# Patient Record
Sex: Female | Born: 1955 | Race: White | Hispanic: No | Smoking: Former smoker
Health system: Southern US, Community
[De-identification: ages and names within clinical notes are randomized; demographics above are authoritative.]

---

## 1997-12-08 ENCOUNTER — Other Ambulatory Visit: Admission: RE | Admit: 1997-12-08 | Discharge: 1997-12-08 | Payer: Self-pay | Admitting: Obstetrics and Gynecology

## 1998-11-23 ENCOUNTER — Other Ambulatory Visit: Admission: RE | Admit: 1998-11-23 | Discharge: 1998-11-23 | Payer: Self-pay | Admitting: Psychiatry

## 1999-01-04 ENCOUNTER — Other Ambulatory Visit: Admission: RE | Admit: 1999-01-04 | Discharge: 1999-01-04 | Payer: Self-pay | Admitting: Urology

## 1999-01-10 ENCOUNTER — Other Ambulatory Visit: Admission: RE | Admit: 1999-01-10 | Discharge: 1999-01-10 | Payer: Self-pay | Admitting: Obstetrics and Gynecology

## 1999-01-22 ENCOUNTER — Encounter: Admission: RE | Admit: 1999-01-22 | Discharge: 1999-01-22 | Payer: Self-pay | Admitting: Urology

## 1999-01-22 ENCOUNTER — Encounter: Payer: Self-pay | Admitting: Urology

## 1999-11-27 ENCOUNTER — Other Ambulatory Visit: Admission: RE | Admit: 1999-11-27 | Discharge: 1999-11-27 | Payer: Self-pay | Admitting: Obstetrics and Gynecology

## 2000-12-08 ENCOUNTER — Other Ambulatory Visit: Admission: RE | Admit: 2000-12-08 | Discharge: 2000-12-08 | Payer: Self-pay | Admitting: Obstetrics and Gynecology

## 2001-01-13 ENCOUNTER — Ambulatory Visit (HOSPITAL_COMMUNITY): Admission: RE | Admit: 2001-01-13 | Discharge: 2001-01-13 | Payer: Self-pay | Admitting: Obstetrics and Gynecology

## 2001-01-13 ENCOUNTER — Encounter: Payer: Self-pay | Admitting: Obstetrics and Gynecology

## 2001-11-09 ENCOUNTER — Other Ambulatory Visit: Admission: RE | Admit: 2001-11-09 | Discharge: 2001-11-09 | Payer: Self-pay | Admitting: Obstetrics and Gynecology

## 2002-12-07 ENCOUNTER — Other Ambulatory Visit: Admission: RE | Admit: 2002-12-07 | Discharge: 2002-12-07 | Payer: Self-pay | Admitting: Obstetrics and Gynecology

## 2005-03-18 ENCOUNTER — Encounter: Admission: RE | Admit: 2005-03-18 | Discharge: 2005-03-18 | Payer: Self-pay | Admitting: Obstetrics and Gynecology

## 2005-04-04 ENCOUNTER — Encounter: Admission: RE | Admit: 2005-04-04 | Discharge: 2005-04-04 | Payer: Self-pay | Admitting: Obstetrics and Gynecology

## 2005-04-10 ENCOUNTER — Encounter: Admission: RE | Admit: 2005-04-10 | Discharge: 2005-04-10 | Payer: Self-pay | Admitting: Obstetrics and Gynecology

## 2006-05-05 ENCOUNTER — Encounter: Admission: RE | Admit: 2006-05-05 | Discharge: 2006-05-05 | Payer: Self-pay | Admitting: Obstetrics and Gynecology

## 2007-01-06 ENCOUNTER — Other Ambulatory Visit: Admission: RE | Admit: 2007-01-06 | Discharge: 2007-01-06 | Payer: Self-pay | Admitting: Obstetrics and Gynecology

## 2007-06-03 ENCOUNTER — Encounter: Admission: RE | Admit: 2007-06-03 | Discharge: 2007-06-03 | Payer: Self-pay | Admitting: Obstetrics and Gynecology

## 2007-11-11 ENCOUNTER — Ambulatory Visit (HOSPITAL_COMMUNITY): Admission: RE | Admit: 2007-11-11 | Discharge: 2007-11-11 | Payer: Self-pay | Admitting: Internal Medicine

## 2007-12-04 ENCOUNTER — Ambulatory Visit: Payer: Self-pay | Admitting: Internal Medicine

## 2007-12-18 ENCOUNTER — Ambulatory Visit: Payer: Self-pay | Admitting: Internal Medicine

## 2008-08-08 ENCOUNTER — Encounter: Admission: RE | Admit: 2008-08-08 | Discharge: 2008-08-08 | Payer: Self-pay | Admitting: Internal Medicine

## 2009-04-16 ENCOUNTER — Ambulatory Visit: Payer: Self-pay | Admitting: Family Medicine

## 2009-04-16 DIAGNOSIS — J309 Allergic rhinitis, unspecified: Secondary | ICD-10-CM | POA: Insufficient documentation

## 2009-11-15 ENCOUNTER — Encounter: Admission: RE | Admit: 2009-11-15 | Discharge: 2009-11-15 | Payer: Self-pay | Admitting: Internal Medicine

## 2010-03-27 NOTE — Assessment & Plan Note (Signed)
Summary: R ears stopped up x 2 dys rm 1   Vital Signs:  Patient Profile:   55 Years Old Female Height:     67.5 inches Weight:      193 pounds O2 Sat:      100 % O2 treatment:    Room Air Temp:     97.2 degrees F oral Pulse rate:   71 / minute Pulse rhythm:   regular Resp:     16 per minute BP sitting:   140 / 83  (right arm) Cuff size:   regular  Vitals Entered By: Areta Haber CMA (April 16, 2009 3:37 PM)                  Current Allergies: No known allergies History of Present Illness History from: patient Chief Complaint: R ear stopped up x 2dys History of Present Illness: h/o allergies and asthma as child comes c/o  dyscomfort in her right ear for 2 days. Feels like her ear is "stuffed up" and some sounds are  magnified.  No fever no cough no malaise. Mild throat dyscomfort. Normal apetite. Had flu vaccine this year   Current Problems: ALLERGIC RHINITIS CAUSE UNSPECIFIED (ICD-477.9)   Current Meds VITAMIN D 1000 UNIT TABS (CHOLECALCIFEROL) 2 tabs per week BUDEPRION SR 150 MG XR12H-TAB (BUPROPION HCL) 1 tab by mouth once daily  REVIEW OF SYSTEMS Constitutional Symptoms      Denies fever, chills, night sweats, weight loss, weight gain, and fatigue.  Eyes       Denies change in vision, eye pain, eye discharge, glasses, contact lenses, and eye surgery. Ear/Nose/Throat/Mouth       Denies hearing loss/aids, change in hearing, dizziness, frequent runny nose, frequent nose bleeds, sinus problems, sore throat, hoarseness, and tooth pain or bleeding.      Comments: R ear stopped up x 2 dys  Respiratory       Denies dry cough, productive cough, wheezing, shortness of breath, asthma, bronchitis, and emphysema/COPD.  Cardiovascular       Denies murmurs, chest pain, and tires easily with exhertion.    Gastrointestinal       Denies stomach pain, nausea/vomiting, diarrhea, constipation, blood in bowel movements, and indigestion. Genitourniary       Denies painful  urination, kidney stones, and loss of urinary control. Neurological       Denies paralysis, seizures, and fainting/blackouts. Musculoskeletal       Denies muscle pain, joint pain, joint stiffness, decreased range of motion, redness, swelling, muscle weakness, and gout.  Skin       Denies bruising, unusual mles/lumps or sores, and hair/skin or nail changes.  Psych       Denies mood changes, temper/anger issues, anxiety/stress, speech problems, depression, and sleep problems. Physical Exam General appearance: well developed, well nourished, no acute distress Head: normocephalic, atraumatic Eyes: conjunctivae and lids normal Pupils: equal, round, reactive to light Ears: Impress clear fluid behind Normal looking TM's. Nasal: mucosa pink, nonedematous, no septal deviation, turbinates normal Oral/Pharynx: tongue normal, posterior pharynx without erythema or exudate. Post nasal drip. Neck: neck supple,  trachea midline, no masses. Not enlarged or tender lymphnodes. Chest/Lungs: no rales, wheezes, or rhonchi bilateral, breath sounds equal without effort Heart: regular rate and  rhythm, no murmur Assessment New Problems: ALLERGIC RHINITIS CAUSE UNSPECIFIED (ICD-477.9)  Likely flid behing TM due to allergic rhinitis causing pressure and dyscomfort. Or early signs of a viral URI. Reccomended to use a a decongestant  two times a day  for 1 week. (claritin-D12 or ZyrtecD12) Follow up with primary doctor if persistent or worsening symptoms.   Plan New Orders: Est. Patient Level III [99213]  The patient and/or caregiver has been counseled thoroughly with regard to medications prescribed including dosage, schedule, interactions, rationale for use, and possible side effects and they verbalize understanding.  Diagnoses and expected course of recovery discussed and will return if not improved as expected or if the condition worsens. Patient and/or caregiver verbalized understanding.   Patient  Instructions: 1)  Your ear dyscomfort is likely caused by allergies or it could be an early sign of a coming viral infection. 2)  Use a decongestant like claritin d-12 take twice a day for the next week. 3)  Follow up with your primary doctor if persistent dyscomfort after 1 week or sooner if worsening.  CC:  R ear stopped up x 2dys.

## 2010-12-20 ENCOUNTER — Other Ambulatory Visit: Payer: Self-pay | Admitting: Internal Medicine

## 2010-12-20 DIAGNOSIS — Z1231 Encounter for screening mammogram for malignant neoplasm of breast: Secondary | ICD-10-CM

## 2011-01-28 ENCOUNTER — Ambulatory Visit
Admission: RE | Admit: 2011-01-28 | Discharge: 2011-01-28 | Disposition: A | Discharge status: Home / Self Care | Payer: BC Managed Care – PPO | Source: Ambulatory Visit | Attending: Internal Medicine | Admitting: Internal Medicine

## 2011-01-28 DIAGNOSIS — Z1231 Encounter for screening mammogram for malignant neoplasm of breast: Secondary | ICD-10-CM

## 2012-01-15 ENCOUNTER — Other Ambulatory Visit: Payer: Self-pay | Admitting: Internal Medicine

## 2012-01-15 DIAGNOSIS — Z1231 Encounter for screening mammogram for malignant neoplasm of breast: Secondary | ICD-10-CM

## 2012-02-06 ENCOUNTER — Ambulatory Visit (HOSPITAL_BASED_OUTPATIENT_CLINIC_OR_DEPARTMENT_OTHER)
Admission: RE | Admit: 2012-02-06 | Discharge: 2012-02-06 | Disposition: A | Discharge status: Home / Self Care | Payer: BC Managed Care – PPO | Source: Ambulatory Visit | Attending: Internal Medicine | Admitting: Internal Medicine

## 2012-02-06 ENCOUNTER — Ambulatory Visit: Payer: BC Managed Care – PPO

## 2012-02-06 DIAGNOSIS — Z1231 Encounter for screening mammogram for malignant neoplasm of breast: Secondary | ICD-10-CM | POA: Insufficient documentation

## 2012-07-31 ENCOUNTER — Encounter: Payer: Self-pay | Admitting: Physician Assistant

## 2012-07-31 ENCOUNTER — Ambulatory Visit (INDEPENDENT_AMBULATORY_CARE_PROVIDER_SITE_OTHER): Payer: BC Managed Care – PPO | Admitting: Physician Assistant

## 2012-07-31 VITALS — BP 138/90 | HR 80 | Wt 199.0 lb

## 2012-07-31 DIAGNOSIS — Z131 Encounter for screening for diabetes mellitus: Secondary | ICD-10-CM

## 2012-07-31 DIAGNOSIS — M25562 Pain in left knee: Secondary | ICD-10-CM

## 2012-07-31 DIAGNOSIS — F329 Major depressive disorder, single episode, unspecified: Secondary | ICD-10-CM | POA: Insufficient documentation

## 2012-07-31 DIAGNOSIS — M25569 Pain in unspecified knee: Secondary | ICD-10-CM

## 2012-07-31 DIAGNOSIS — E785 Hyperlipidemia, unspecified: Secondary | ICD-10-CM

## 2012-07-31 MED ORDER — BUPROPION HCL ER (XL) 150 MG PO TB24: 150.0000 mg | ORAL_TABLET | Freq: Every day | ORAL | Status: DC

## 2012-07-31 NOTE — Patient Instructions (Addendum)
Take glucosamine chondrotin twice a day.   Osteoarthritis Osteoarthritis is the most common form of arthritis. It is redness, soreness, and swelling (inflammation) affecting the cartilage. Cartilage acts as a cushion, covering the ends of bones where they meet to form a joint. CAUSES  Over time, the cartilage begins to wear away. This causes bone to rub on bone. This produces pain and stiffness in the affected joints. Factors that contribute to this problem are:  Excessive body weight.  Age.  Overuse of joints. SYMPTOMS   People with osteoarthritis usually experience joint pain, swelling, or stiffness.  Over time, the joint may lose its normal shape.  Small deposits of bone (osteophytes) may grow on the edges of the joint.  Bits of bone or cartilage can break off and float inside the joint space. This may cause more pain and damage.  Osteoarthritis can lead to depression, anxiety, feelings of helplessness, and limitations on daily activities. The most commonly affected joints are in the:  Ends of the fingers.  Thumbs.  Neck.  Lower back.  Knees.  Hips. DIAGNOSIS  Diagnosis is mostly based on your symptoms and exam. Tests may be helpful, including:  X-rays of the affected joint.  A computerized magnetic scan (MRI).  Blood tests to rule out other types of arthritis.  Joint fluid tests. This involves using a needle to draw fluid from the joint and examining the fluid under a microscope. TREATMENT  Goals of treatment are to control pain, improve joint function, maintain a normal body weight, and maintain a healthy lifestyle. Treatment approaches may include:  A prescribed exercise program with rest and joint relief.  Weight control with nutritional education.  Pain relief techniques such as:  Properly applied heat and cold.  Electric pulses delivered to nerve endings under the skin (transcutaneous electrical nerve stimulation, TENS).  Massage.  Certain  supplements. Ask your caregiver before using any supplements, especially in combination with prescribed drugs.  Medicines to control pain, such as:  Acetaminophen.  Nonsteroidal anti-inflammatory drugs (NSAIDs), such as naproxen.  Narcotic or central-acting agents, such as tramadol. This drug carries a risk of addiction and is generally prescribed for short-term use.  Corticosteroids. These can be given orally or as injection. This is a short-term treatment, not recommended for routine use.  Surgery to reposition the bones and relieve pain (osteotomy) or to remove loose pieces of bone and cartilage. Joint replacement may be needed in advanced states of osteoarthritis. HOME CARE INSTRUCTIONS  Your caregiver can recommend specific types of exercise. These may include:  Strengthening exercises. These are done to strengthen the muscles that support joints affected by arthritis. They can be performed with weights or with exercise bands to add resistance.  Aerobic activities. These are exercises, such as brisk walking or low-impact aerobics, that get your heart pumping. They can help keep your lungs and circulatory system in shape.  Range-of-motion activities. These keep your joints limber.  Balance and agility exercises. These help you maintain daily living skills. Learning about your condition and being actively involved in your care will help improve the course of your osteoarthritis. SEEK MEDICAL CARE IF:   You feel hot or your skin turns red.  You develop a rash in addition to your joint pain.  You have an oral temperature above 102 F (38.9 C). FOR MORE INFORMATION  National Institute of Arthritis and Musculoskeletal and Skin Diseases: www.niams.http://www.myers.net/ [employer] on Aging: https://walker.com/ American College of Rheumatology: www.rheumatology.org Document Released: 02/11/2005 Document Revised: 05/06/2011 Document  Reviewed: 05/25/2009 ExitCare Patient Information 2014  Exit[place], [place]. 1.5 Gram Low Sodium Diet A 1.5 gram sodium diet restricts the amount of sodium in the diet to no more than 1.5 g or 1500 mg daily. The American Heart Association recommends Americans over the age of 55 to consume no more than 1500 mg of sodium each day to reduce the risk of developing high blood pressure. Research also shows that limiting sodium may reduce heart attack and stroke risk. Many foods contain sodium for flavor and sometimes as a preservative. When the amount of sodium in a diet needs to be low, it is important to know what to look for when choosing foods and drinks. The following includes some information and guidelines to help make it easier for you to adapt to a low sodium diet. QUICK TIPS  Do not add salt to food.  Avoid convenience items and fast food.  Choose unsalted snack foods.  Buy lower sodium products, often labeled as "lower sodium" or "no salt added."  Check food labels to learn how much sodium is in 1 serving.  When eating at a restaurant, ask that your food be prepared with less salt or none, if possible. READING FOOD LABELS FOR SODIUM INFORMATION The nutrition facts label is a good place to find how much sodium is in foods. Look for products with no more than 400 mg of sodium per serving. Remember that 1.5 g = 1500 mg. The food label may also list foods as:  Sodium-free: Less than 5 mg in a serving.  Very low sodium: 35 mg or less in a serving.  Low-sodium: 140 mg or less in a serving.  Light in sodium: 50% less sodium in a serving. For example, if a food that usually has 300 mg of sodium is changed to become light in sodium, it will have 150 mg of sodium.  Reduced sodium: 25% less sodium in a serving. For example, if a food that usually has 400 mg of sodium is changed to reduced sodium, it will have 300 mg of sodium. CHOOSING FOODS Grains  Avoid: Salted crackers and snack items. Some cereals, including instant hot cereals. Bread stuffing  and biscuit mixes. Seasoned rice or pasta mixes.  Choose: Unsalted snack items. Low-sodium cereals, oats, puffed wheat and rice, shredded wheat. English muffins and bread. Pasta. Meats  Avoid: Salted, canned, smoked, spiced, pickled meats, including fish and poultry. Bacon, ham, sausage, cold cuts, hot dogs, anchovies.  Choose: Low-sodium canned tuna and salmon. Fresh or frozen meat, poultry, and fish. Dairy  Avoid: Processed cheese and spreads. Cottage cheese. Buttermilk and condensed milk. Regular cheese.  Choose: Milk. Low-sodium cottage cheese. Yogurt. Sour cream. Low-sodium cheese. Fruits and Vegetables  Avoid: Regular canned vegetables. Regular canned tomato sauce and paste. Frozen vegetables in sauces. Olives. Rosita Fire. Relishes. Sauerkraut.  Choose: Low-sodium canned vegetables. Low-sodium tomato sauce and paste. Frozen or fresh vegetables. Fresh and frozen fruit. Condiments  Avoid: Canned and packaged gravies. Worcestershire sauce. Tartar sauce. Barbecue sauce. Soy sauce. Steak sauce. Ketchup. Onion, garlic, and table salt. Meat flavorings and tenderizers.  Choose: Fresh and dried herbs and spices. Low-sodium varieties of mustard and ketchup. Lemon juice. Tabasco sauce. Horseradish. SAMPLE 1.5 GRAM SODIUM MEAL PLAN Breakfast / Sodium (mg)  1 cup low-fat milk / 143 mg  1 whole-wheat English muffin / 240 mg  1 tbs heart-healthy margarine / 153 mg  1 hard-boiled egg / 139 mg  1 small orange / 0 mg Lunch / Sodium (mg)  1 cup raw carrots / 76 mg  2 tbs no salt added peanut butter / 5 mg  2 slices whole-wheat bread / 270 mg  1 tbs jelly / 6 mg   cup red grapes / 2 mg Dinner / Sodium (mg)  1 cup whole-wheat pasta / 2 mg  1 cup low-sodium tomato sauce / 73 mg  3 oz lean ground beef / 57 mg  1 small side salad (1 cup raw spinach leaves,  cup cucumber,  cup yellow bell pepper) with 1 tsp olive oil and 1 tsp red wine vinegar / 25 mg Snack / Sodium (mg)  1  container low-fat vanilla yogurt / 107 mg  3 graham cracker squares / 127 mg Nutrient Analysis  Calories: 1745  Protein: 75 g  Carbohydrate: 237 g  Fat: 57 g  Sodium: 1425 mg Document Released: 02/11/2005 Document Revised: 05/06/2011 Document Reviewed: 05/15/2009 ExitCare Patient Information 2014 [place], [place].

## 2012-08-02 NOTE — Progress Notes (Addendum)
  Subjective:    Patient ID: Kelsey Brady, female    DOB: [birth year], 57 y.o.   MRN: [id number]  HPI Patient is a 57 yo female who presents to the clinic to establish care. PMH positive for depression and hyperlipidemia. She is currently going to a mental health facility to get wellbutrin and not on any meds for high cholesterol. She is very opposed to taking a statin. She does take red yeast rice and fish oil to help lower cholesterol. Has not had checked in many years. She is very well controlled with depression on wellbutrin with no complaints. She tries to keep a balanced diet low in fat but admits she has gained weight over past couple of years and not exercising. No history of problems with hypertension. BP elevated today.Denies any CP, palpitations, SOB, HA's, vision changes.   Her left knee occasionally causing some short term pain. Mostly with activity such as walking up stairs or walking for long periods of time. Pain is not everyday. Denies any knee catching or giving way. She denies any injury. She has not tried anything for knee pain. Left only right know.   Mammogram 01/2012. Pap not been done in years.    Review of Systems  All other systems reviewed and are negative.       Objective:   Physical Exam  Constitutional: She appears well-developed and well-nourished.  HENT:  Head: Normocephalic and atraumatic.  Neck: Normal range of motion. Neck supple. No thyromegaly present.  Cardiovascular: Normal rate, regular rhythm and normal heart sounds.   Pulmonary/Chest: Effort normal and breath sounds normal. She has no wheezes.  Musculoskeletal:  Left knee- Normal ROM. Strength 5/5. DTR's 2+. No joint tenderness to palpation. Negative anterior drawer and mcmurrays. Gait is normal.   Skin: Skin is warm and dry.  Psychiatric: She has a normal mood and affect.          Assessment & Plan:  Hyperlipidemia- Gave lab slip to follow up and talk about a plan to manage  cholesterol.   Elevated blood pressure- rechecked and was lower. Will continue to monitor.  Discussed low salt diet, regular exercise and focused weight loss.   Left knee pain- I discussed x-rays. She wanted to wait. I agreed that symptoms sounded like OA and she has not tried anything to make better. Discussed glucosamine chondroitin, continuing to be active, and ibuprofen as needed. Follow up if worsening and could get imaging and stronger meds if needed.  Depression- managed by mental health. Well controlled.   Discussed need for CPE in near future.

## 2012-10-14 LAB — LIPID PANEL
Cholesterol: 231 mg/dL — ABNORMAL HIGH (ref 0–200)
Total CHOL/HDL Ratio: 5 Ratio
Triglycerides: 108 mg/dL (ref <150)

## 2012-10-14 LAB — COMPLETE METABOLIC PANEL WITH GFR
Albumin: 4.2 g/dL (ref 3.5–5.2)
Alkaline Phosphatase: 75 U/L (ref 39–117)
BUN: 13 mg/dL (ref 6–23)
Calcium: 9.2 mg/dL (ref 8.4–10.5)
Chloride: 106 mEq/L (ref 96–112)
Creat: 0.93 mg/dL (ref 0.50–1.10)
GFR, Est Non African American: 68 mL/min
Glucose, Bld: 87 mg/dL (ref 70–99)
Potassium: 4.3 mEq/L (ref 3.5–5.3)

## 2012-10-16 ENCOUNTER — Ambulatory Visit: Payer: BC Managed Care – PPO | Admitting: Physician Assistant

## 2012-10-16 DIAGNOSIS — Z0289 Encounter for other administrative examinations: Secondary | ICD-10-CM

## 2012-10-30 ENCOUNTER — Ambulatory Visit (INDEPENDENT_AMBULATORY_CARE_PROVIDER_SITE_OTHER): Payer: BC Managed Care – PPO | Admitting: Physician Assistant

## 2012-10-30 ENCOUNTER — Encounter: Payer: Self-pay | Admitting: Physician Assistant

## 2012-10-30 VITALS — BP 154/82 | HR 72 | Wt 182.0 lb

## 2012-10-30 DIAGNOSIS — R1013 Epigastric pain: Secondary | ICD-10-CM

## 2012-10-30 DIAGNOSIS — W19XXXA Unspecified fall, initial encounter: Secondary | ICD-10-CM

## 2012-10-30 DIAGNOSIS — Z9181 History of falling: Secondary | ICD-10-CM

## 2012-10-30 NOTE — Patient Instructions (Addendum)
Try nexium daily in am 30 minutes before breakfast.   Will call with ultrasound.

## 2012-10-30 NOTE — Progress Notes (Signed)
  Subjective:    Patient ID: Kelsey Brady, female    DOB: 1955/09/11, 57 y.o.   MRN: [id number]  HPI Patient presents to the clinic with some epigastric discomfort for the past month. She was camping at the end of July and she feel over a tent wire and hit the ground landing on her left upper abdomen. Pt feels like pain did not start until then. She was bruised up but no signifant pain. No increased pain with deep breaths.  She feels like she is well healed except for her discomfort over her left to mid upper quadrant. She is also having a lot of burning. Denies any reflux up her esophagus. Denies any nausea. She feels like discomfort is all the time and not made worst with food. She has not tried anything to make better.      Review of Systems     Objective:   Physical Exam  Constitutional: She is oriented to person, place, and time. She appears well-developed and well-nourished.  HENT:  Head: Normocephalic and atraumatic.  Cardiovascular: Normal rate, regular rhythm and normal heart sounds.   Pulmonary/Chest: Effort normal and breath sounds normal. She has no wheezes.  Abdominal: Soft. Bowel sounds are normal.  Just a little discomfort with palpation over epigastric area. No pain over sternum. NO LUQ or RUQ pain.  Neurological: She is alert and oriented to person, place, and time.  Skin: Skin is warm and dry.  Psychiatric: She has a normal mood and affect. Her behavior is normal.          Assessment & Plan:  Epigastric discomfort- I am not sure if fall and symptoms are related. She could be having some GERD symptoms or perhaps fall caused hital hernia that is contributing to discomfort. I gave samples of nexium to try. Will also get an ultrasound to make sure no masses contributing to symptoms. Pt is instructed to call office if gets worse or not improving.

## 2012-11-04 ENCOUNTER — Ambulatory Visit (INDEPENDENT_AMBULATORY_CARE_PROVIDER_SITE_OTHER): Payer: BC Managed Care – PPO

## 2012-11-04 DIAGNOSIS — R1013 Epigastric pain: Secondary | ICD-10-CM

## 2012-11-05 ENCOUNTER — Other Ambulatory Visit: Payer: BC Managed Care – PPO

## 2013-03-30 ENCOUNTER — Other Ambulatory Visit: Payer: Self-pay | Admitting: Physician Assistant

## 2013-03-30 DIAGNOSIS — Z1231 Encounter for screening mammogram for malignant neoplasm of breast: Secondary | ICD-10-CM

## 2013-04-01 ENCOUNTER — Ambulatory Visit: Payer: BC Managed Care – PPO

## 2013-04-08 ENCOUNTER — Ambulatory Visit (INDEPENDENT_AMBULATORY_CARE_PROVIDER_SITE_OTHER): Payer: BC Managed Care – PPO

## 2013-04-08 DIAGNOSIS — Z1231 Encounter for screening mammogram for malignant neoplasm of breast: Secondary | ICD-10-CM

## 2013-05-12 ENCOUNTER — Encounter (HOSPITAL_COMMUNITY): Payer: Self-pay | Admitting: Emergency Medicine

## 2013-05-12 ENCOUNTER — Emergency Department (HOSPITAL_COMMUNITY)
Admission: EM | Admit: 2013-05-12 | Discharge: 2013-05-12 | Discharge status: Against Medical Advice | Payer: Worker's Compensation | Attending: Emergency Medicine | Admitting: Emergency Medicine

## 2013-05-12 DIAGNOSIS — X58XXXA Exposure to other specified factors, initial encounter: Secondary | ICD-10-CM | POA: Diagnosis not present

## 2013-05-12 DIAGNOSIS — W19XXXA Unspecified fall, initial encounter: Secondary | ICD-10-CM

## 2013-05-12 DIAGNOSIS — S59909A Unspecified injury of unspecified elbow, initial encounter: Secondary | ICD-10-CM | POA: Insufficient documentation

## 2013-05-12 DIAGNOSIS — Y9389 Activity, other specified: Secondary | ICD-10-CM | POA: Diagnosis not present

## 2013-05-12 DIAGNOSIS — Y929 Unspecified place or not applicable: Secondary | ICD-10-CM | POA: Diagnosis not present

## 2013-05-12 DIAGNOSIS — S61209A Unspecified open wound of unspecified finger without damage to nail, initial encounter: Secondary | ICD-10-CM | POA: Insufficient documentation

## 2013-05-12 DIAGNOSIS — S6990XA Unspecified injury of unspecified wrist, hand and finger(s), initial encounter: Secondary | ICD-10-CM | POA: Insufficient documentation

## 2013-05-12 DIAGNOSIS — S59919A Unspecified injury of unspecified forearm, initial encounter: Secondary | ICD-10-CM

## 2013-05-12 MED ORDER — KETOROLAC TROMETHAMINE 60 MG/2ML IM SOLN
60.0000 mg | Freq: Once | INTRAMUSCULAR | Status: AC
  Administered 2013-05-12: 60 mg via INTRAMUSCULAR
  Filled 2013-05-12: qty 2

## 2013-05-12 MED ORDER — HYDROCODONE-ACETAMINOPHEN 5-325 MG PO TABS: 1.0000 | ORAL_TABLET | Freq: Four times a day (QID) | ORAL | Status: DC | PRN

## 2013-05-12 NOTE — ED Notes (Addendum)
Phlebotomy called for a workman's comp urine drug screen.

## 2013-05-12 NOTE — ED Notes (Signed)
PA at bedside.

## 2013-05-12 NOTE — Discharge Instructions (Signed)
Contusion A contusion is a deep bruise. Contusions are the result of an injury that caused bleeding under the skin. The contusion may turn blue, purple, or yellow. Minor injuries will give you a painless contusion, but more severe contusions may stay painful and swollen for a few weeks.  CAUSES  A contusion is usually caused by a blow, trauma, or direct force to an area of the body. SYMPTOMS   Swelling and redness of the injured area.  Bruising of the injured area.  Tenderness and soreness of the injured area.  Pain. DIAGNOSIS  The diagnosis can be made by taking a history and physical exam. An X-ray, CT scan, or MRI may be needed to determine if there were any associated injuries, such as fractures. TREATMENT  Specific treatment will depend on what area of the body was injured. In general, the best treatment for a contusion is resting, icing, elevating, and applying cold compresses to the injured area. Over-the-counter medicines may also be recommended for pain control. Ask your caregiver what the best treatment is for your contusion. HOME CARE INSTRUCTIONS   Put ice on the injured area.  Put ice in a plastic bag.  Place a towel between your skin and the bag.  Leave the ice on for 15-20 minutes, 03-04 times a day.  Only take over-the-counter or prescription medicines for pain, discomfort, or fever as directed by your caregiver. Your caregiver may recommend avoiding anti-inflammatory medicines (aspirin, ibuprofen, and naproxen) for 48 hours because these medicines may increase bruising.  Rest the injured area.  If possible, elevate the injured area to reduce swelling. SEEK IMMEDIATE MEDICAL CARE IF:   You have increased bruising or swelling.  You have pain that is getting worse.  Your swelling or pain is not relieved with medicines. MAKE SURE YOU:   Understand these instructions.  Will watch your condition.  Will get help right away if you are not doing well or get  worse. Document Released: 11/21/2004 Document Revised: 05/06/2011 Document Reviewed: 12/17/2010 [facility] Patient Information 2014 [place], [place].   Triple antibiotic ointment to facial abrasions Follow-up with orthodontist for front tooth Hydrocodone at night for mod-severe pain Irrigate mouth for lacerations to the inside of your lip Return if symptoms worsen or if dizziness

## 2013-05-12 NOTE — ED Notes (Signed)
Pt in stating she tripped on the sidewalk today, landed on her face, abrasions noted to right side of face, c/o pain to inside of her mouth, denies LOC

## 2013-05-12 NOTE — ED Notes (Signed)
PT ambulated with baseline gait; VSS; A&Ox3; no signs of distress; respirations even and unlabored; skin warm and dry; no questions upon discharge.  

## 2013-05-14 ENCOUNTER — Ambulatory Visit (INDEPENDENT_AMBULATORY_CARE_PROVIDER_SITE_OTHER): Payer: BC Managed Care – PPO | Admitting: Physician Assistant

## 2013-05-14 ENCOUNTER — Encounter: Payer: Self-pay | Admitting: Physician Assistant

## 2013-05-14 VITALS — BP 149/95 | HR 88 | Wt 182.0 lb

## 2013-05-14 DIAGNOSIS — T148XXA Other injury of unspecified body region, initial encounter: Secondary | ICD-10-CM

## 2013-05-14 DIAGNOSIS — F1290 Cannabis use, unspecified, uncomplicated: Secondary | ICD-10-CM

## 2013-05-14 DIAGNOSIS — S01501A Unspecified open wound of lip, initial encounter: Secondary | ICD-10-CM

## 2013-05-14 DIAGNOSIS — IMO0002 Reserved for concepts with insufficient information to code with codable children: Secondary | ICD-10-CM

## 2013-05-14 DIAGNOSIS — F1210 Cannabis abuse, uncomplicated: Secondary | ICD-10-CM

## 2013-05-14 DIAGNOSIS — S01511A Laceration without foreign body of lip, initial encounter: Secondary | ICD-10-CM

## 2013-05-14 DIAGNOSIS — W19XXXA Unspecified fall, initial encounter: Secondary | ICD-10-CM

## 2013-05-14 MED ORDER — KETOROLAC TROMETHAMINE 60 MG/2ML IM SOLN
60.0000 mg | Freq: Once | INTRAMUSCULAR | Status: AC
  Administered 2013-05-14: 60 mg via INTRAMUSCULAR

## 2013-05-14 MED ORDER — LIDOCAINE 5 % EX OINT: TOPICAL_OINTMENT | CUTANEOUS | Status: DC

## 2013-05-14 NOTE — Progress Notes (Signed)
Subjective:    Patient ID: Kelsey Brady, female    DOB: 1955/12/07, 58 y.o.   MRN: [id number]  HPI  Patient is a 58 year old female who presents to the clinic to followup after a fall on 05/12/2013. Patient was walking about the office and there was an uneven spot in the sidewalk which her foot call and she landed on her left knee and face planted on the cement. She immediately went to the emergency room. They gave her a shot of Toradol and Vicodin for pain and sent her home. She has multiple cuts on in her mouth as well as she felt like a frontal tooth was loose. She has significant abrasions on the right side of her face. She's taking a few Vicodin but they do not seem to help. She does feel like pain is improving. She is very concerned about the way her scars will heal on her face. Patient still has significant amount of swelling. Patient did not lose consciousness. Patient denies any ongoing headache, nausea, vomiting, dizziness or any other postconcussion syndrome symptoms.  Patient is concerned and wants to be on [place] about her marijuana usage. She occasionally uses him small amount of marijuana to help with sleep and upset stomach. She's been using marijuana in small dosages since she was 18. She is aware there might be some drug testing but wants to be honest with physician that she has been on this for long time and never had any incidents of falling. She is 100% sure that this month drug use is not the reason why she fell.       Review of Systems     Objective:   Physical Exam  Constitutional: She appears well-developed and well-nourished.  HENT:  3/4 of upper lip is significantly swollen with 3  tiny lacerations underneath the upper lip. Frontal teeth were palpated and seemed stable.  Abrasion from under the right nare are up and 2 right under the right eye. Abrasion could be described as erythematous patch with some minor discharge. The scab are already seems to be forming  over top.  There was significant swelling around right eye with some bruising.  Eyes: Conjunctivae and EOM are normal. Pupils are equal, round, and reactive to light. Right eye exhibits no discharge. Left eye exhibits no discharge.  Swelling around right eye with bruising.   Cardiovascular: Normal rate, regular rhythm and normal heart sounds.   Pulmonary/Chest: Effort normal and breath sounds normal. She has no wheezes.  Musculoskeletal:   bilateral good range of motion at knee. There was significant bruising around the left medial knee. No significant swelling. Strength 5 out of 5 bilaterally.  Neurological: She is alert.  Skin: Skin is dry.  Psychiatric:  Patient seen tearful but stable. She is upset about cosmetically what her face will look like.          Assessment & Plan:  Fall/facial abrasions/mouth abrasions/contusion to left knee-I did write patient out of work until next Wednesday. She is very self-conscious about her abrasions to face and swelling of. I would like for the swelling to go down before going back to work as well as soreness.. encouraged patient to keep appt with dentisit to offical check teeth they do feel stable today. I did give some xeroform for pt to take home as well as apply to facial lacerations today. The flu this will help with scar formation. Encouraged vitamin E cream mixed with neosporin for the next 3 days. Then  she can stop neosporin but continue vitamin E. Lidoderm gel given for mouth sores as they heal. Discussed I saw no signs of infection today. Mouth sores should heal fast since so vascular. Shot of toradol 60mg  given today to help with swelling and pain. Continue with NSAIDS and cold compresses on sore areas. It may takes weeks to completely have no soreness. Pt did not want any more vicodin. May consider protocol near him for prevention of scars once abrasion seemed to be filling more.  I did document her use of marijuana-do not think this is the  cause of all. She's been using this for her needs for 20+ years. She does not use on a daily basis.  Spent 30 minutes with patient greater than 50% of visit spent counseling patient regarding treatment of abrasions and lacerations.

## 2013-05-14 NOTE — Patient Instructions (Addendum)
Start ibuprofen 800mg  for up to 3 times a day.  Mix vitamin E with neosporin at least 4 times day. Stop neosporin 3 days.  Cold compresses.  Lidoderm gel for mouth and painful skin areas.   Consider nerium AD for scaring.

## 2014-01-17 ENCOUNTER — Ambulatory Visit: Payer: Self-pay | Admitting: Physician Assistant

## 2014-01-24 ENCOUNTER — Ambulatory Visit (INDEPENDENT_AMBULATORY_CARE_PROVIDER_SITE_OTHER): Payer: BC Managed Care – PPO | Admitting: Physician Assistant

## 2014-01-24 ENCOUNTER — Encounter: Payer: Self-pay | Admitting: Physician Assistant

## 2014-01-24 VITALS — BP 142/84 | HR 85 | Ht 67.5 in | Wt 196.0 lb

## 2014-01-24 DIAGNOSIS — R030 Elevated blood-pressure reading, without diagnosis of hypertension: Secondary | ICD-10-CM

## 2014-01-24 DIAGNOSIS — F329 Major depressive disorder, single episode, unspecified: Secondary | ICD-10-CM | POA: Diagnosis not present

## 2014-01-24 DIAGNOSIS — IMO0001 Reserved for inherently not codable concepts without codable children: Secondary | ICD-10-CM | POA: Insufficient documentation

## 2014-01-24 DIAGNOSIS — G5760 Lesion of plantar nerve, unspecified lower limb: Secondary | ICD-10-CM | POA: Insufficient documentation

## 2014-01-24 DIAGNOSIS — G5762 Lesion of plantar nerve, left lower limb: Secondary | ICD-10-CM | POA: Diagnosis not present

## 2014-01-24 DIAGNOSIS — F32A Depression, unspecified: Secondary | ICD-10-CM

## 2014-01-24 MED ORDER — BUPROPION HCL ER (XL) 150 MG PO TB24: 150.0000 mg | ORAL_TABLET | Freq: Every day | ORAL | Status: DC

## 2014-01-24 NOTE — Progress Notes (Signed)
   Subjective:    Patient ID: Kelsey Brady, female    DOB: 26-Jun-1955, 58 y.o.   MRN: [id number]  HPI Pt would also like refill on wellbutrin. Her psychiatrist stopped seeing pts. Doing well very controlled on current dosage. No suicidal or homicidal thoughts.   Pt has morton neuroma of left foot. She has been seen by podiatry and not impressed. She does not want surgery. Years ago had inject that left her pain free for 5 years. Would like another injection.    Review of Systems  All other systems reviewed and are negative.      Objective:   Physical Exam  Constitutional: She is oriented to person, place, and time. She appears well-developed and well-nourished.  HENT:  Head: Normocephalic and atraumatic.  Cardiovascular: Normal rate, regular rhythm and normal heart sounds.   Pulmonary/Chest: Effort normal and breath sounds normal.  Neurological: She is alert and oriented to person, place, and time.  Skin: Skin is dry.  Psychiatric: She has a normal mood and affect. Her behavior is normal.          Assessment & Plan:  Elevated blood pressure- rechecked and better. Gave DASH diet. Has gained 14lbs since last visit. Discussed weight loss. Follow up at [facility].   Depression- refilled wellbutrin for 1 year. PHQ-9 was 2.   morton neuroma, left foot- referred to Dr. Karie Schwalbe for injection.   Discussed zostavax injection. Pt not ready to proceed will discuss again at [facility].

## 2014-01-24 NOTE — Patient Instructions (Addendum)
DASH Eating Plan °DASH stands for "Dietary Approaches to Stop Hypertension." The DASH eating plan is a healthy eating plan that has been shown to reduce high blood pressure (hypertension). Additional health benefits may include reducing the risk of type 2 diabetes mellitus, heart disease, and stroke. The DASH eating plan may also help with weight loss. °WHAT DO I NEED TO KNOW ABOUT THE DASH EATING PLAN? °For the DASH eating plan, you will follow these general guidelines: °· Choose foods with a percent daily value for sodium of less than 5% (as listed on the food label). °· Use salt-free seasonings or herbs instead of table salt or sea salt. °· Check with your health care provider or pharmacist before using salt substitutes. °· Eat lower-sodium products, often labeled as "lower sodium" or "no salt added." °· Eat fresh foods. °· Eat more vegetables, fruits, and low-fat dairy products. °· Choose whole grains. Look for the word "whole" as the first word in the ingredient list. °· Choose fish and skinless chicken or turkey more often than red meat. Limit fish, poultry, and meat to 6 oz (170 g) each day. °· Limit sweets, desserts, sugars, and sugary drinks. °· Choose heart-healthy fats. °· Limit cheese to 1 oz (28 g) per day. °· Eat more home-cooked food and less restaurant, buffet, and fast food. °· Limit fried foods. °· Cook foods using methods other than frying. °· Limit canned vegetables. If you do use them, rinse them well to decrease the sodium. °· When eating at a restaurant, ask that your food be prepared with less salt, or no salt if possible. °WHAT FOODS CAN I EAT? °Seek help from a dietitian for individual calorie needs. °Grains °Whole grain or whole wheat bread. Brown rice. Whole grain or whole wheat pasta. Quinoa, bulgur, and whole grain cereals. Low-sodium cereals. Corn or whole wheat flour tortillas. Whole grain cornbread. Whole grain crackers. Low-sodium crackers. °Vegetables °Fresh or frozen vegetables  (raw, steamed, roasted, or grilled). Low-sodium or reduced-sodium tomato and vegetable juices. Low-sodium or reduced-sodium tomato sauce and paste. Low-sodium or reduced-sodium canned vegetables.  °Fruits °All fresh, canned (in natural juice), or frozen fruits. °Meat and Other Protein Products °Ground beef (85% or leaner), grass-fed beef, or beef trimmed of fat. Skinless chicken or turkey. Ground chicken or turkey. Pork trimmed of fat. All fish and seafood. Eggs. Dried beans, peas, or lentils. Unsalted nuts and seeds. Unsalted canned beans. °Dairy °Low-fat dairy products, such as skim or 1% milk, 2% or reduced-fat cheeses, low-fat ricotta or cottage cheese, or plain low-fat yogurt. Low-sodium or reduced-sodium cheeses. °Fats and Oils °Tub margarines without trans fats. Light or reduced-fat mayonnaise and salad dressings (reduced sodium). Avocado. Safflower, olive, or canola oils. Natural peanut or almond butter. °Other °Unsalted popcorn and pretzels. °The items listed above may not be a complete list of recommended foods or beverages. Contact your dietitian for more options. °WHAT FOODS ARE NOT RECOMMENDED? °Grains °White bread. White pasta. White rice. Refined cornbread. Bagels and croissants. Crackers that contain trans fat. °Vegetables °Creamed or fried vegetables. Vegetables in a cheese sauce. Regular canned vegetables. Regular canned tomato sauce and paste. Regular tomato and vegetable juices. °Fruits °Dried fruits. Canned fruit in light or heavy syrup. Fruit juice. °Meat and Other Protein Products °Fatty cuts of meat. Ribs, chicken wings, bacon, sausage, bologna, salami, chitterlings, fatback, hot dogs, bratwurst, and packaged luncheon meats. Salted nuts and seeds. Canned beans with salt. °Dairy °Whole or 2% milk, cream, half-and-half, and cream cheese. Whole-fat or sweetened yogurt. Full-fat   cheeses or blue cheese. Nondairy creamers and whipped toppings. Processed cheese, cheese spreads, or cheese  curds. °Condiments °Onion and garlic salt, seasoned salt, table salt, and sea salt. Canned and packaged gravies. Worcestershire sauce. Tartar sauce. Barbecue sauce. Teriyaki sauce. Soy sauce, including reduced sodium. Steak sauce. Fish sauce. Oyster sauce. Cocktail sauce. Horseradish. Ketchup and mustard. Meat flavorings and tenderizers. Bouillon cubes. Hot sauce. Tabasco sauce. Marinades. Taco seasonings. Relishes. °Fats and Oils °Butter, stick margarine, lard, shortening, ghee, and bacon fat. Coconut, palm kernel, or palm oils. Regular salad dressings. °Other °Pickles and olives. Salted popcorn and pretzels. °The items listed above may not be a complete list of foods and beverages to avoid. Contact your dietitian for more information. °WHERE CAN I FIND MORE INFORMATION? °National Heart, Lung, and [employer] Institute: www.nhlbi.nih.gov/health/health-topics/topics/dash/ °Document Released: 01/31/2011 Document Revised: 06/28/2013 Document Reviewed: 12/16/2012 °ExitCare® Patient Information ©2015 [employer]. This information is not intended to replace advice given to you by your health care provider. Make sure you discuss any questions you have with your health care provider. ° °

## 2014-02-03 ENCOUNTER — Other Ambulatory Visit: Payer: Self-pay | Admitting: *Deleted

## 2014-02-03 MED ORDER — BUPROPION HCL ER (XL) 150 MG PO TB24: 150.0000 mg | ORAL_TABLET | Freq: Every day | ORAL | Status: AC

## 2014-05-26 ENCOUNTER — Other Ambulatory Visit: Payer: Self-pay | Admitting: Physician Assistant

## 2014-05-26 DIAGNOSIS — Z1231 Encounter for screening mammogram for malignant neoplasm of breast: Secondary | ICD-10-CM

## 2014-07-04 ENCOUNTER — Ambulatory Visit (HOSPITAL_COMMUNITY): Payer: Self-pay

## 2014-07-18 ENCOUNTER — Ambulatory Visit (HOSPITAL_COMMUNITY): Payer: Self-pay

## 2014-08-10 ENCOUNTER — Ambulatory Visit (INDEPENDENT_AMBULATORY_CARE_PROVIDER_SITE_OTHER): Payer: BC Managed Care – PPO

## 2014-08-10 DIAGNOSIS — Z1231 Encounter for screening mammogram for malignant neoplasm of breast: Secondary | ICD-10-CM | POA: Diagnosis not present

## 2014-09-15 ENCOUNTER — Ambulatory Visit (INDEPENDENT_AMBULATORY_CARE_PROVIDER_SITE_OTHER): Payer: BC Managed Care – PPO | Admitting: Family Medicine

## 2014-09-15 ENCOUNTER — Encounter: Payer: Self-pay | Admitting: Family Medicine

## 2014-09-15 VITALS — BP 144/92 | HR 82 | Temp 98.1°F | Wt 193.0 lb

## 2014-09-15 DIAGNOSIS — J0101 Acute recurrent maxillary sinusitis: Secondary | ICD-10-CM

## 2014-09-15 DIAGNOSIS — J329 Chronic sinusitis, unspecified: Secondary | ICD-10-CM | POA: Insufficient documentation

## 2014-09-15 MED ORDER — AZITHROMYCIN 250 MG PO TABS: 250.0000 mg | ORAL_TABLET | Freq: Every day | ORAL | Status: AC

## 2014-09-15 MED ORDER — IPRATROPIUM BROMIDE 0.06 % NA SOLN: 2.0000 | Freq: Four times a day (QID) | NASAL | Status: AC

## 2014-09-15 MED ORDER — PREDNISONE 10 MG PO TABS: 30.0000 mg | ORAL_TABLET | Freq: Every day | ORAL | Status: AC

## 2014-09-15 NOTE — Progress Notes (Signed)
Kelsey Brady is a 59 y.o. female who presents to [facility]  today for sinus pressure or congestion cough. Symptoms present for about a week and a half. No fevers chills nausea vomiting or diarrhea. Over-the-counter medicines have been somewhat helpful. She feels well otherwise.    No past medical history on file. No past surgical history on file. History  Substance Use Topics  . Smoking status: Former Games developer  . Smokeless tobacco: Not on file  . Alcohol Use: Yes   ROS as above Medications: Current Outpatient Prescriptions  Medication Sig Dispense Refill  . buPROPion (WELLBUTRIN XL) 150 MG 24 hr tablet Take 1 tablet (150 mg total) by mouth daily. 30 tablet 11  . fish oil-omega-3 fatty acids 1000 MG capsule Take 3 g by mouth daily.     . Flaxseed, Linseed, (FLAX SEED OIL PO) Take by mouth daily.    Marland Kitchen glucosamine-chondroitin 500-400 MG tablet Take 1 tablet by mouth 3 (three) times daily.    . Multiple Vitamin (MULTIVITAMIN) tablet Take 1 tablet by mouth daily.    . Red Yeast Rice 600 MG CAPS Take 1 capsule by mouth daily.    Marland Kitchen azithromycin (ZITHROMAX) 250 MG tablet Take 1 tablet (250 mg total) by mouth daily. Take first 2 tablets together, then 1 every day until finished. 6 tablet 0  . ipratropium (ATROVENT) 0.06 % nasal spray Place 2 sprays into both nostrils 4 (four) times daily. 15 mL 1  . predniSONE (DELTASONE) 10 MG tablet Take 3 tablets (30 mg total) by mouth daily. 15 tablet 0   No current facility-administered medications for this visit.   No Known Allergies   Exam:  BP 144/92 mmHg  Pulse 82  Temp(Src) 98.1 F (36.7 C) (Oral)  Wt 193 lb (87.544 kg)  SpO2 97% Gen: Well NAD HEENT: EOMI,  MMM clear nasal discharge. Posterior pharynx cobblestoning. Normal tympanic membranes bilaterally. Nontender maxillary and frontal sinuses. Lungs: Normal work of breathing. CTABL Heart: RRR no MRG Abd: NABS, Soft. Nondistended, Nontender Exts:  Brisk capillary refill, warm and well perfused.   No results found for this or any previous visit (from the past 24 hour(s)). No results found.   Please see individual assessment and plan sections.

## 2014-09-15 NOTE — Patient Instructions (Signed)
Thank you for coming in today. Call or go to the emergency room if you get worse, have trouble breathing, have chest pains, or palpitations.  Use azithromycin if not better.   Sinusitis Sinusitis is redness, soreness, and inflammation of the paranasal sinuses. Paranasal sinuses are air pockets within the bones of your face (beneath the eyes, the middle of the forehead, or above the eyes). In healthy paranasal sinuses, mucus is able to drain out, and air is able to circulate through them by way of your nose. However, when your paranasal sinuses are inflamed, mucus and air can become trapped. This can allow bacteria and other germs to grow and cause infection. Sinusitis can develop quickly and last only a short time (acute) or continue over a long period (chronic). Sinusitis that lasts for more than 12 weeks is considered chronic.  CAUSES  Causes of sinusitis include:  Allergies.  Structural abnormalities, such as displacement of the cartilage that separates your nostrils (deviated septum), which can decrease the air flow through your nose and sinuses and affect sinus drainage.  Functional abnormalities, such as when the small hairs (cilia) that line your sinuses and help remove mucus do not work properly or are not present. SIGNS AND SYMPTOMS  Symptoms of acute and chronic sinusitis are the same. The primary symptoms are pain and pressure around the affected sinuses. Other symptoms include:  Upper toothache.  Earache.  Headache.  Bad breath.  Decreased sense of smell and taste.  A cough, which worsens when you are lying flat.  Fatigue.  Fever.  Thick drainage from your nose, which often is green and may contain pus (purulent).  Swelling and warmth over the affected sinuses. DIAGNOSIS  Your health care provider will perform a physical exam. During the exam, your health care provider may:  Look in your nose for signs of abnormal growths in your nostrils (nasal polyps).  Tap  over the affected sinus to check for signs of infection.  View the inside of your sinuses (endoscopy) using an imaging device that has a light attached (endoscope). If your health care provider suspects that you have chronic sinusitis, one or more of the following tests may be recommended:  Allergy tests.  Nasal culture. A sample of mucus is taken from your nose, sent to a lab, and screened for bacteria.  Nasal cytology. A sample of mucus is taken from your nose and examined by your health care provider to determine if your sinusitis is related to an allergy. TREATMENT  Most cases of acute sinusitis are related to a viral infection and will resolve on their own within 10 days. Sometimes medicines are prescribed to help relieve symptoms (pain medicine, decongestants, nasal steroid sprays, or saline sprays).  However, for sinusitis related to a bacterial infection, your health care provider will prescribe antibiotic medicines. These are medicines that will help kill the bacteria causing the infection.  Rarely, sinusitis is caused by a fungal infection. In theses cases, your health care provider will prescribe antifungal medicine. For some cases of chronic sinusitis, surgery is needed. Generally, these are cases in which sinusitis recurs more than 3 times per year, despite other treatments. HOME CARE INSTRUCTIONS   Drink plenty of water. Water helps thin the mucus so your sinuses can drain more easily.  Use a humidifier.  Inhale steam 3 to 4 times a day (for example, sit in the bathroom with the shower running).  Apply a warm, moist washcloth to your face 3 to 4 times a  day, or as directed by your health care provider.  Use saline nasal sprays to help moisten and clean your sinuses.  Take medicines only as directed by your health care provider.  If you were prescribed either an antibiotic or antifungal medicine, finish it all even if you start to feel better. SEEK IMMEDIATE MEDICAL CARE  IF:  You have increasing pain or severe headaches.  You have nausea, vomiting, or drowsiness.  You have swelling around your face.  You have vision problems.  You have a stiff neck.  You have difficulty breathing. MAKE SURE YOU:   Understand these instructions.  Will watch your condition.  Will get help right away if you are not doing well or get worse. Document Released: 02/11/2005 Document Revised: 06/28/2013 Document Reviewed: 02/26/2011 [facility] Patient Information 2015 [place], [place]. This information is not intended to replace advice given to you by your health care provider. Make sure you discuss any questions you have with your health care provider.

## 2014-09-15 NOTE — Assessment & Plan Note (Signed)
Sinusitis and bronchitis. Likely viral. Treat with prednisone and Atrovent nasal spray and azithromycin if not better.

## 2014-12-29 ENCOUNTER — Encounter: Payer: Self-pay | Admitting: Internal Medicine

## 2015-02-28 IMAGING — US US ABDOMEN COMPLETE
1 series · 14 of 25 positions shown · non-contrast
Comparison: None.

CLINICAL DATA: Epigastric discomfort.  The patient fell 1 month
ago.

COMPLETE ABDOMINAL ULTRASOUND

[Series 1: us abdomen complete · 0.21mm/px · 14 of 83 slices shown]
[im 1/83]
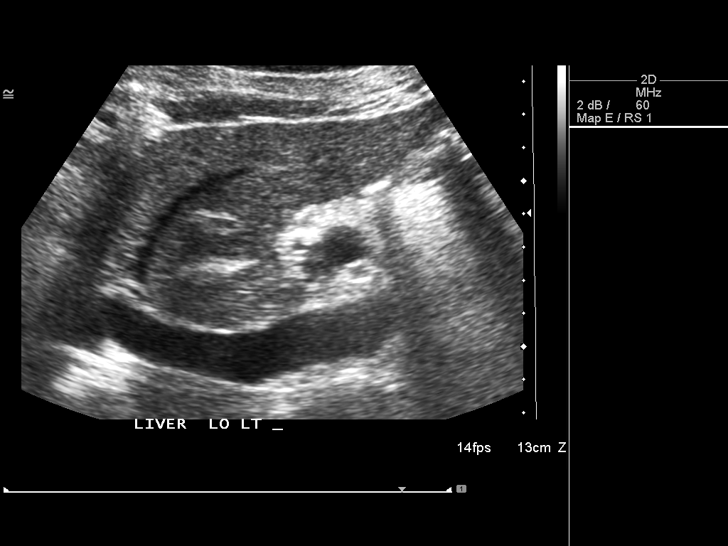
[im 7/83]
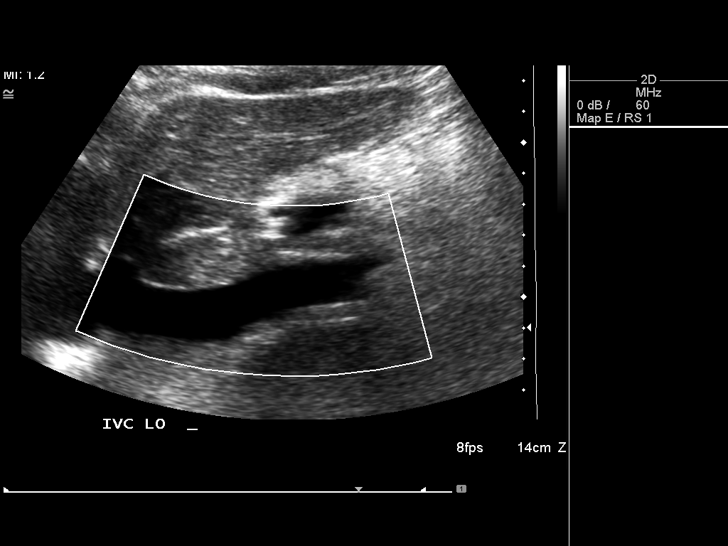
[im 14/83]
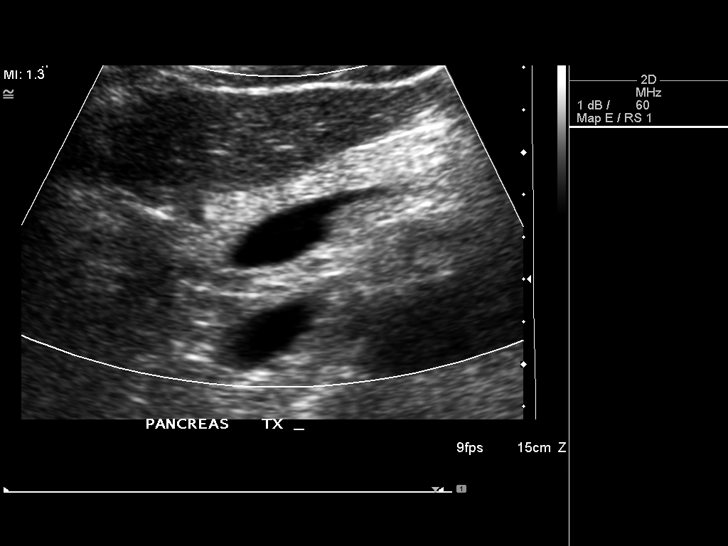
[im 21/83]
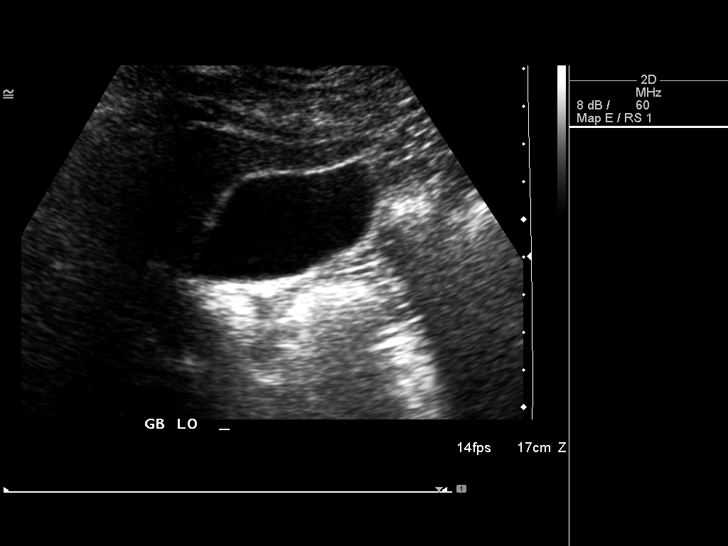
[im 28/83]
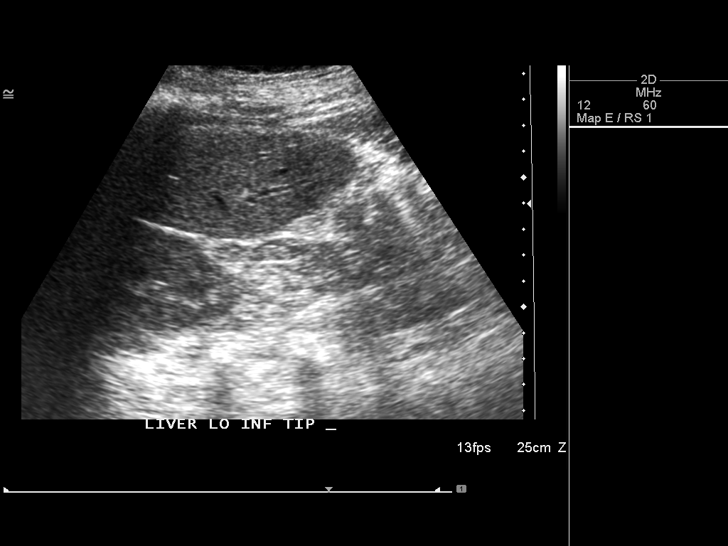
[im 31/83]
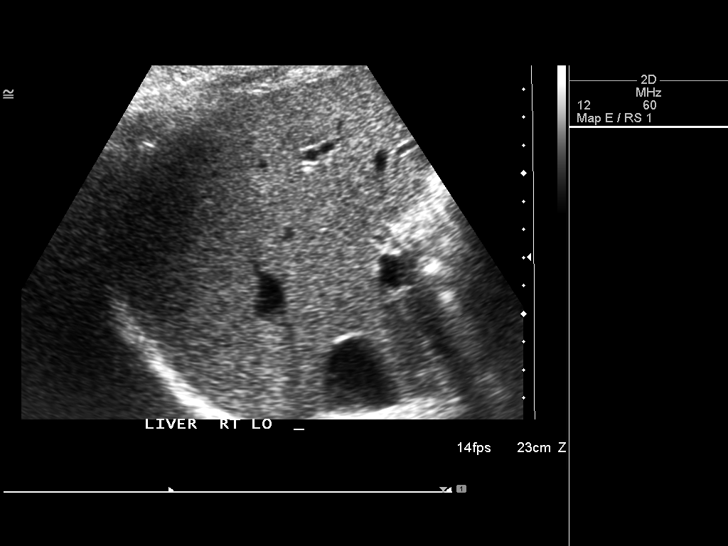
[im 38/83]
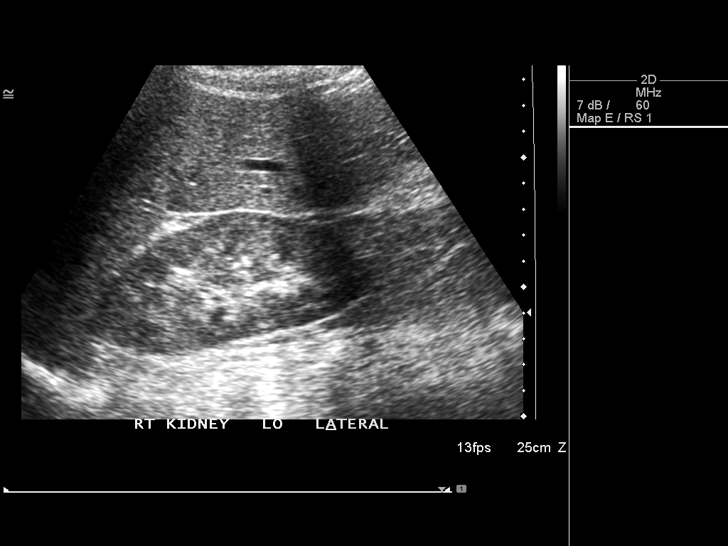
[im 45/83]
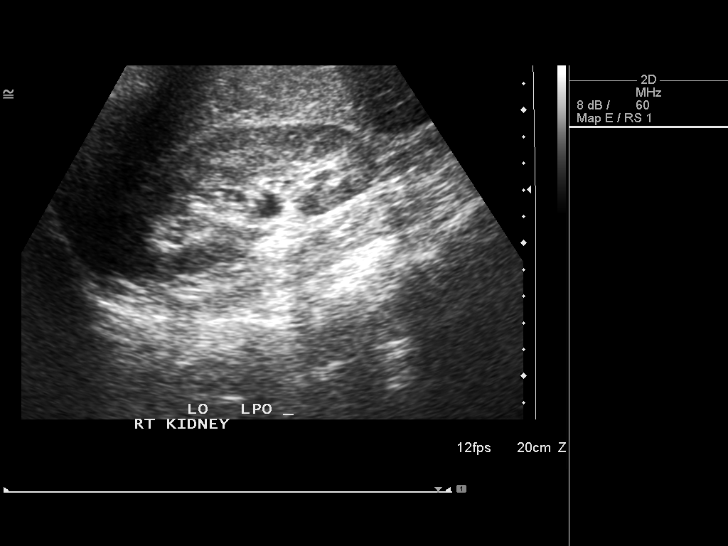
[im 52/83]
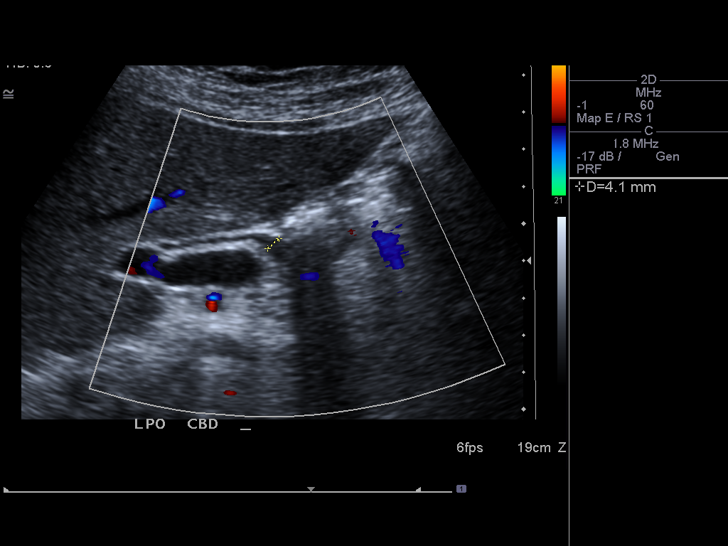
[im 55/83]
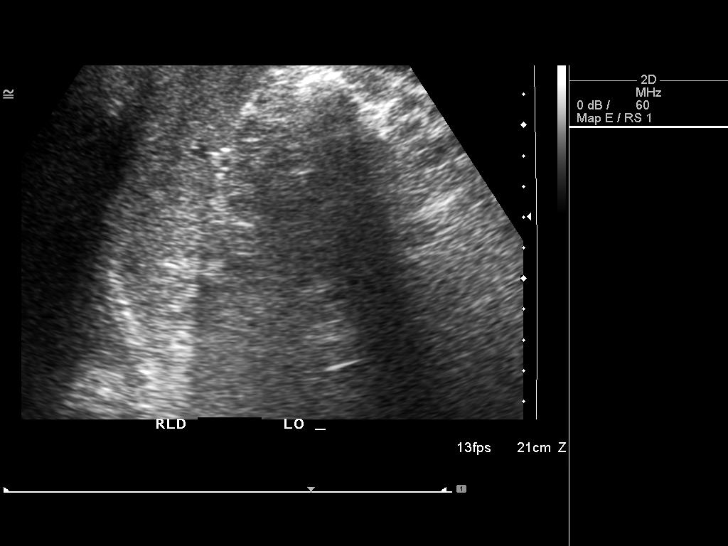
[im 62/83]
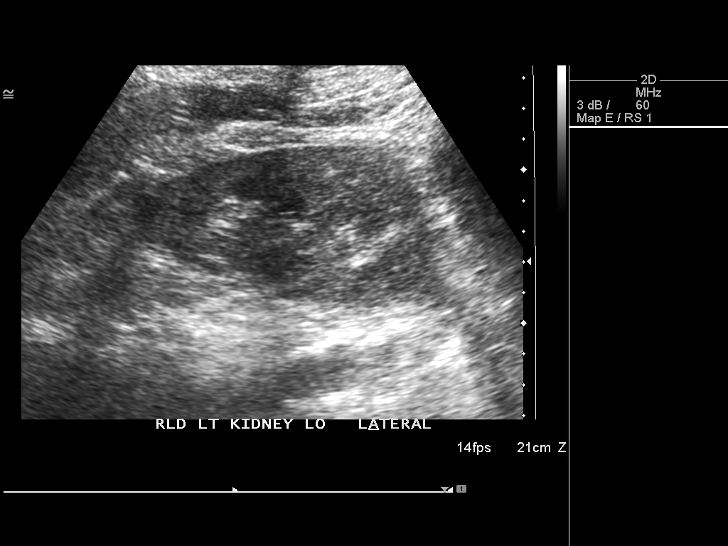
[im 69/83]
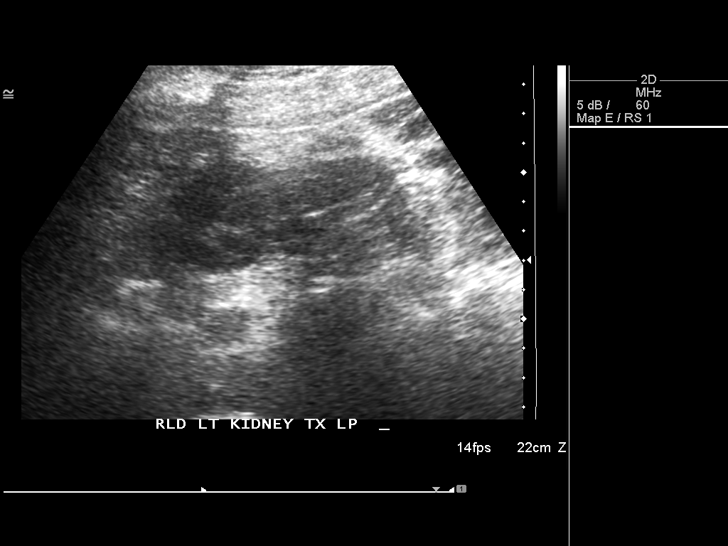
[im 76/83]
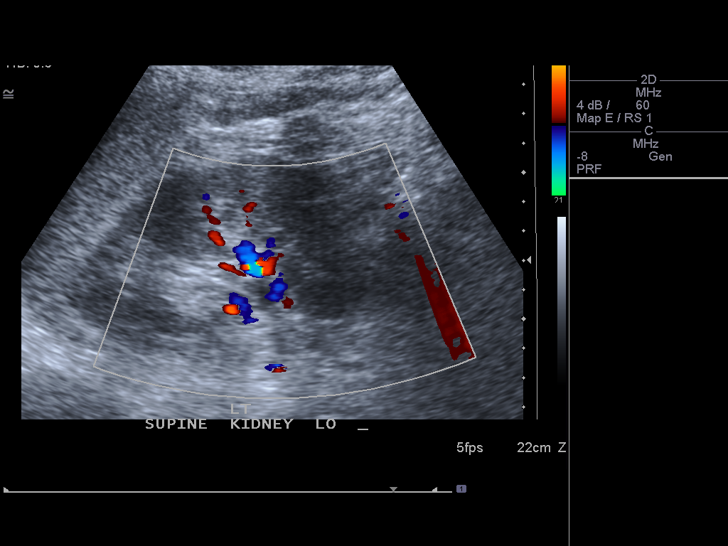
[im 83/83]
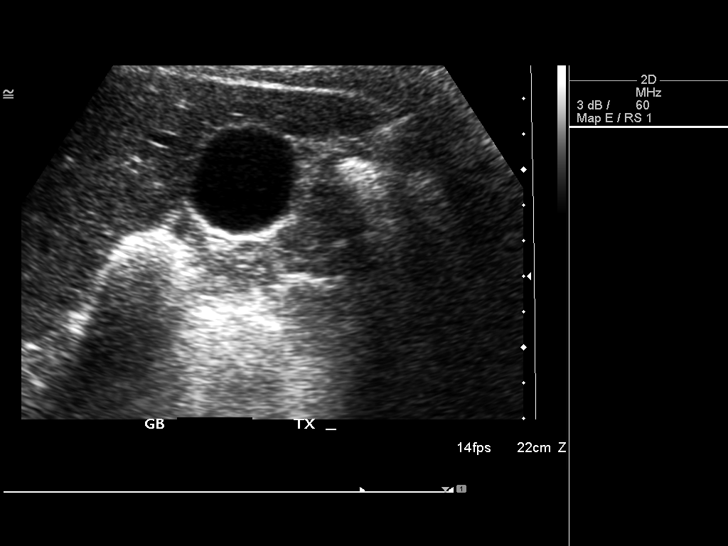

[14 of 25 positions shown; findings below may reference images not displayed]

FINDINGS: Gallbladder:  Gallbladder has a normal appearance.  Gallbladder
wall is 1.3 mm, within normal limits.  No stones or pericholecystic
fluid.  No sonographic Murphy's sign.

Common bile duct:  4.1 mm

Liver:  Homogeneous without focal lesion.

IVC:  Appears normal.

Pancreas:  No focal abnormality seen.

Spleen:  Normal in appearance, 6.5 cm.

Right Kidney:  Normal in appearance, 11.3 cm.

Left Kidney:  Normal in appearance, 11.4 cm.

Abdominal aorta:  Not aneurysmal, 2.2 cm.
IMPRESSION: Normal abdominal ultrasound.

## 2015-10-04 ENCOUNTER — Other Ambulatory Visit: Payer: Self-pay | Admitting: Physician Assistant

## 2015-10-04 DIAGNOSIS — Z1231 Encounter for screening mammogram for malignant neoplasm of breast: Secondary | ICD-10-CM

## 2015-11-27 ENCOUNTER — Ambulatory Visit (HOSPITAL_BASED_OUTPATIENT_CLINIC_OR_DEPARTMENT_OTHER)
Admission: RE | Admit: 2015-11-27 | Discharge: 2015-11-27 | Disposition: A | Discharge status: Home / Self Care | Payer: BC Managed Care – PPO | Source: Ambulatory Visit | Attending: Physician Assistant | Admitting: Physician Assistant

## 2015-11-27 DIAGNOSIS — Z1231 Encounter for screening mammogram for malignant neoplasm of breast: Secondary | ICD-10-CM | POA: Diagnosis present

## 2017-03-03 ENCOUNTER — Other Ambulatory Visit: Payer: Self-pay | Admitting: Physician Assistant

## 2017-03-03 DIAGNOSIS — Z1231 Encounter for screening mammogram for malignant neoplasm of breast: Secondary | ICD-10-CM

## 2017-03-24 ENCOUNTER — Ambulatory Visit (HOSPITAL_BASED_OUTPATIENT_CLINIC_OR_DEPARTMENT_OTHER)
Admission: RE | Admit: 2017-03-24 | Discharge: 2017-03-24 | Disposition: A | Discharge status: Home / Self Care | Payer: BC Managed Care – PPO | Source: Ambulatory Visit | Attending: Physician Assistant | Admitting: Physician Assistant

## 2017-03-24 DIAGNOSIS — Z1231 Encounter for screening mammogram for malignant neoplasm of breast: Secondary | ICD-10-CM

## 2017-03-24 NOTE — Progress Notes (Signed)
Call pt: normal mammogram. Follow up in 1 year.

## 2017-10-09 ENCOUNTER — Encounter: Payer: Self-pay | Admitting: Internal Medicine

## 2020-06-15 ENCOUNTER — Ambulatory Visit (INDEPENDENT_AMBULATORY_CARE_PROVIDER_SITE_OTHER): Payer: Medicare PPO | Admitting: Orthopaedic Surgery

## 2020-06-15 ENCOUNTER — Other Ambulatory Visit: Payer: Self-pay

## 2020-06-15 ENCOUNTER — Encounter: Payer: Self-pay | Admitting: Orthopaedic Surgery

## 2020-06-15 VITALS — Ht 67.5 in | Wt 193.0 lb

## 2020-06-15 DIAGNOSIS — G5762 Lesion of plantar nerve, left lower limb: Secondary | ICD-10-CM | POA: Diagnosis not present

## 2020-06-15 MED ORDER — LIDOCAINE HCL 1 % IJ SOLN
0.5000 mL | INTRAMUSCULAR | Status: AC | PRN
  Administered 2020-06-15: .5 mL

## 2020-06-15 MED ORDER — METHYLPREDNISOLONE ACETATE 40 MG/ML IJ SUSP
13.3300 mg | INTRAMUSCULAR | Status: AC | PRN
  Administered 2020-06-15: 13.33 mg

## 2020-06-15 MED ORDER — BUPIVACAINE HCL 0.25 % IJ SOLN
0.3300 mL | INTRAMUSCULAR | Status: AC | PRN
  Administered 2020-06-15: .33 mL

## 2020-06-15 NOTE — Progress Notes (Signed)
Office Visit Note   Patient: Kelsey Brady           Date of Birth: November 13, 1955           MRN: [id number] Visit Date: 06/15/2020              Requested by: Zachery Dauer, MD [address] [place],  [place] PCP: Zachery Dauer, MD   Assessment & Plan: Visit Diagnoses:  1. Morton's neuroma of left foot     Plan: Kelsey Brady has a several year history of pain between the second and third toes of the left foot that was previously diagnosed by a podiatrist as a "Morton's neuroma".  She has been basically "dealing with it" over long time wearing good comfortable shoes.  On occasion she has had cortisone but its been at least "10 years".  Her symptoms are consistent with a wound with a Morton's neuroma with the burning and tingling in her toes.  She is not interested in surgery.  I will reinject the area of tenderness between the second and third toes and have her return as needed.  Follow-Up Instructions: Return if symptoms worsen or fail to improve.   Orders:  Orders Placed This Encounter  Procedures  . Foot Inj   No orders of the defined types were placed in this encounter.     Procedures: Foot Inj  Date/Time: 06/15/2020 3:27 PM Performed by: Valeria Batman, MD Authorized by: Valeria Batman, MD   Consent Given by:  Patient Condition: Morton's Neuroma   Location:  L foot Needle Size:  27 G Approach:  Dorsal Medications:  0.5 mL lidocaine 1 %; 0.33 mL bupivacaine 0.25 %; 13.33 mg methylPREDNISolone acetate 40 MG/ML     Clinical Data: No additional findings.   Subjective: Chief Complaint  Patient presents with  . Left Foot - Pain  Patient presents today for left foot pain. She said that she has been diagnosed with a Mortons Neuroma. She said that she has seen several podiatrist in the past and has been treated with a cortisone injection. She said that the injections always help, and it has been years since she had one. Her pain is located between the 2nd  and 3rd toe. She said that it is "annoying". The discomfort gets worse with walking. She also states that she fell last year after passing out and hitting her right side of the side of a tub. She had x-rays in [place] and was told that she did not break anything. She continues to have "spasms" in her mid back area. She said that it does not keep her from activities, but wanted to see if there was something she could do.   HPI  Review of Systems   Objective: Vital Signs: Ht 5' 7.5" (1.715 m)   Wt 193 lb (87.5 kg)   BMI 29.78 kg/m   Physical Exam Constitutional:      Appearance: She is well-developed.  Eyes:     Pupils: Pupils are equal, round, and reactive to light.  Pulmonary:     Effort: Pulmonary effort is normal.  Skin:    General: Skin is warm and dry.  Neurological:     Mental Status: She is alert and oriented to person, place, and time.  Psychiatric:        Behavior: Behavior normal.     Ortho Exam left foot with some tenderness between the second and third toes.  No swelling or ecchymosis.  Neurologically  intact.  Minimal discomfort with compression of the second and third metatarsal heads.  Good sensation to both sides of both toes.  Nails intact.  Good capillary refill.  No masses  Specialty Comments:  No specialty comments available.  Imaging: No results found.   PMFS History: Patient Active Problem List   Diagnosis Date Noted  . Morton's neuroma of left foot 06/15/2020  . Sinusitis 09/15/2014  . Morton neuroma 01/24/2014  . Elevated blood pressure 01/24/2014  . Marijuana use 05/14/2013  . Depression 07/31/2012  . Hyperlipidemia 07/31/2012  . ALLERGIC RHINITIS CAUSE UNSPECIFIED 04/16/2009   History reviewed. No pertinent past medical history.  Family History  Problem Relation Age of Onset  . Depression Mother   . Heart attack Father   . Cancer Paternal Aunt   . Hyperlipidemia Paternal Grandmother   . Hypertension Paternal Grandmother   . Heart  attack Paternal Grandfather     History reviewed. No pertinent surgical history. Social History   Occupational History  . Not on file  Tobacco Use  . Smoking status: Former Games developer  . Smokeless tobacco: Not on file  Substance and Sexual Activity  . Alcohol use: Yes  . Drug use: Not on file  . Sexual activity: Yes
